# Patient Record
Sex: Male | Born: 2000
Health system: Southern US, Community
[De-identification: ages and names within clinical notes are randomized; demographics above are authoritative.]

## PROBLEM LIST (undated history)

## (undated) DIAGNOSIS — F909 Attention-deficit hyperactivity disorder, unspecified type: Secondary | ICD-10-CM

## (undated) HISTORY — PX: TYMPANOSTOMY TUBE PLACEMENT: SHX32

---
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
## 2021-02-01 ENCOUNTER — Telehealth (HOSPITAL_COMMUNITY): Payer: 59 | Admitting: Psychiatry

## 2021-04-18 ENCOUNTER — Other Ambulatory Visit (HOSPITAL_COMMUNITY): Payer: Self-pay

## 2021-04-18 MED ORDER — SODIUM FLUORIDE 1.1 % DT PSTE
PASTE | DENTAL | 12 refills | Status: AC
  Filled 2021-04-18 – 2021-04-26 (×2): qty 100, 30d supply, fill #0

## 2021-04-26 ENCOUNTER — Other Ambulatory Visit (HOSPITAL_COMMUNITY): Payer: Self-pay

## 2022-05-01 DIAGNOSIS — A084 Viral intestinal infection, unspecified: Secondary | ICD-10-CM | POA: Diagnosis not present

## 2023-12-27 DIAGNOSIS — J029 Acute pharyngitis, unspecified: Secondary | ICD-10-CM | POA: Diagnosis not present

## 2023-12-27 DIAGNOSIS — R051 Acute cough: Secondary | ICD-10-CM | POA: Diagnosis not present

## 2023-12-27 DIAGNOSIS — J069 Acute upper respiratory infection, unspecified: Secondary | ICD-10-CM | POA: Diagnosis not present
